# Patient Record
Sex: Male | Born: 1940 | Race: White | Hispanic: No | Marital: Married
Health system: Southern US, Community
[De-identification: ages and names within clinical notes are randomized; demographics above are authoritative.]

---

## 2004-03-14 ENCOUNTER — Ambulatory Visit (HOSPITAL_BASED_OUTPATIENT_CLINIC_OR_DEPARTMENT_OTHER): Admission: RE | Admit: 2004-03-14 | Discharge: 2004-03-14 | Payer: Self-pay | Admitting: Orthopedic Surgery

## 2004-08-01 ENCOUNTER — Ambulatory Visit (HOSPITAL_BASED_OUTPATIENT_CLINIC_OR_DEPARTMENT_OTHER): Admission: RE | Admit: 2004-08-01 | Discharge: 2004-08-01 | Payer: Self-pay | Admitting: Orthopedic Surgery

## 2004-11-22 ENCOUNTER — Ambulatory Visit: Payer: Self-pay | Admitting: Internal Medicine

## 2006-12-03 ENCOUNTER — Ambulatory Visit: Payer: Self-pay | Admitting: Unknown Physician Specialty

## 2007-10-31 ENCOUNTER — Ambulatory Visit: Payer: Self-pay | Admitting: Unknown Physician Specialty

## 2010-06-14 ENCOUNTER — Ambulatory Visit: Payer: Self-pay | Admitting: Ophthalmology

## 2012-07-04 ENCOUNTER — Ambulatory Visit: Payer: Self-pay | Admitting: Unknown Physician Specialty

## 2012-09-16 ENCOUNTER — Ambulatory Visit: Payer: Self-pay

## 2012-09-16 ENCOUNTER — Other Ambulatory Visit: Payer: Self-pay

## 2012-09-16 LAB — CK-MB: CK-MB: 1.5 ng/mL (ref 0.5–3.6)

## 2012-09-16 LAB — TROPONIN I: Troponin-I: <0.02 ng/mL

## 2012-09-19 ENCOUNTER — Ambulatory Visit: Payer: Self-pay | Admitting: Cardiothoracic Surgery

## 2012-09-19 LAB — CBC CANCER CENTER
Eosinophil #: 0.1 x10 3/mm (ref 0.0–0.7)
Eosinophil %: 1.9 %
HCT: 46.9 % (ref 40.0–52.0)
HGB: 16.3 g/dL (ref 13.0–18.0)
MCH: 30.6 pg (ref 26.0–34.0)
MCHC: 34.8 g/dL (ref 32.0–36.0)
MCV: 88 fL (ref 80–100)
Monocyte %: 7.2 %
Neutrophil #: 5.1 x10 3/mm (ref 1.4–6.5)
Neutrophil %: 66 %
Platelet: 115 x10 3/mm — ABNORMAL LOW (ref 150–440)
WBC: 7.7 x10 3/mm (ref 3.8–10.6)

## 2012-09-19 LAB — PROTIME-INR: INR: 1

## 2012-09-19 LAB — COMPREHENSIVE METABOLIC PANEL
Alkaline Phosphatase: 151 U/L — ABNORMAL HIGH (ref 50–136)
Anion Gap: 10 (ref 7–16)
Bilirubin,Total: 0.4 mg/dL (ref 0.2–1.0)
Chloride: 101 mmol/L (ref 98–107)
Co2: 28 mmol/L (ref 21–32)
EGFR (African American): >60
EGFR (Non-African Amer.): >60
Glucose: 184 mg/dL — ABNORMAL HIGH (ref 65–99)
Osmolality: 283 (ref 275–301)
Potassium: 3.7 mmol/L (ref 3.5–5.1)
SGOT(AST): 31 U/L (ref 15–37)

## 2012-09-19 LAB — APTT: Activated PTT: 28.6 secs (ref 23.6–35.9)

## 2012-09-24 ENCOUNTER — Ambulatory Visit: Payer: Self-pay | Admitting: Cardiothoracic Surgery

## 2012-09-27 ENCOUNTER — Ambulatory Visit: Payer: Self-pay | Admitting: Internal Medicine

## 2012-09-29 ENCOUNTER — Ambulatory Visit: Payer: Self-pay | Admitting: Cardiothoracic Surgery

## 2012-09-29 ENCOUNTER — Ambulatory Visit: Payer: Self-pay | Admitting: Oncology

## 2012-10-01 LAB — CBC CANCER CENTER
Basophil #: 0.1 x10 3/mm (ref 0.0–0.1)
Basophil %: 0.7 %
Eosinophil %: 0.5 %
HCT: 47.6 % (ref 40.0–52.0)
Lymphocyte #: 2.9 x10 3/mm (ref 1.0–3.6)
MCH: 30.5 pg (ref 26.0–34.0)
MCHC: 34.5 g/dL (ref 32.0–36.0)
MCV: 89 fL (ref 80–100)
Neutrophil #: 11.7 x10 3/mm — ABNORMAL HIGH (ref 1.4–6.5)
Platelet: 159 x10 3/mm (ref 150–440)
RBC: 5.38 10*6/uL (ref 4.40–5.90)
RDW: 14 % (ref 11.5–14.5)

## 2012-10-01 LAB — COMPREHENSIVE METABOLIC PANEL
Albumin: 3.8 g/dL (ref 3.4–5.0)
Alkaline Phosphatase: 151 U/L — ABNORMAL HIGH (ref 50–136)
Anion Gap: 10 (ref 7–16)
BUN: 20 mg/dL — ABNORMAL HIGH (ref 7–18)
Bilirubin,Total: 1.5 mg/dL — ABNORMAL HIGH (ref 0.2–1.0)
Co2: 26 mmol/L (ref 21–32)
EGFR (African American): >60
Osmolality: 279 (ref 275–301)

## 2012-10-08 LAB — URINALYSIS, COMPLETE
Bacteria: NONE SEEN
Bilirubin,UR: NEGATIVE
Glucose,UR: >=500 mg/dL (ref 0–75)
Ketone: NEGATIVE
Leukocyte Esterase: NEGATIVE
Nitrite: NEGATIVE
Ph: 5 (ref 4.5–8.0)
Protein: NEGATIVE
Specific Gravity: 1.022 (ref 1.003–1.030)
Squamous Epithelial: <1

## 2012-10-08 LAB — CBC CANCER CENTER
HCT: 42.3 % (ref 40.0–52.0)
Lymphocyte #: 2.2 x10 3/mm (ref 1.0–3.6)
MCHC: 34.4 g/dL (ref 32.0–36.0)
MCV: 88 fL (ref 80–100)
Monocyte #: 0.2 x10 3/mm (ref 0.2–1.0)
Monocyte %: 1.3 %
Neutrophil #: 12.4 x10 3/mm — ABNORMAL HIGH (ref 1.4–6.5)
Neutrophil %: 82.1 %
RBC: 4.8 10*6/uL (ref 4.40–5.90)
WBC: 15.1 x10 3/mm — ABNORMAL HIGH (ref 3.8–10.6)

## 2012-10-10 LAB — URINE CULTURE

## 2012-10-11 ENCOUNTER — Ambulatory Visit: Payer: Self-pay | Admitting: Vascular Surgery

## 2012-10-16 LAB — CBC CANCER CENTER
Basophil #: 0.1 x10 3/mm (ref 0.0–0.1)
Basophil %: 1.1 %
HGB: 14.1 g/dL (ref 13.0–18.0)
Lymphocyte #: 1.9 x10 3/mm (ref 1.0–3.6)
MCH: 30.4 pg (ref 26.0–34.0)
Monocyte #: 0.8 x10 3/mm (ref 0.2–1.0)
Neutrophil #: 6.3 x10 3/mm (ref 1.4–6.5)
RDW: 13.9 % (ref 11.5–14.5)

## 2012-10-23 ENCOUNTER — Emergency Department: Payer: Self-pay | Admitting: Emergency Medicine

## 2012-10-23 LAB — CBC CANCER CENTER
Basophil #: 0.1 x10 3/mm (ref 0.0–0.1)
Eosinophil #: 0 x10 3/mm (ref 0.0–0.7)
Eosinophil %: 0.5 %
HCT: 37.6 % — ABNORMAL LOW (ref 40.0–52.0)
HGB: 13.1 g/dL (ref 13.0–18.0)
Lymphocyte %: 19 %
MCHC: 34.9 g/dL (ref 32.0–36.0)
MCV: 88 fL (ref 80–100)
Monocyte #: 0.8 x10 3/mm (ref 0.2–1.0)
Monocyte %: 9.1 %
Neutrophil #: 5.9 x10 3/mm (ref 1.4–6.5)
Platelet: 129 x10 3/mm — ABNORMAL LOW (ref 150–440)
RBC: 4.28 10*6/uL — ABNORMAL LOW (ref 4.40–5.90)
RDW: 14.4 % (ref 11.5–14.5)

## 2012-10-23 LAB — BASIC METABOLIC PANEL
Anion Gap: 12 (ref 7–16)
BUN: 11 mg/dL (ref 7–18)
Chloride: 102 mmol/L (ref 98–107)
Co2: 21 mmol/L (ref 21–32)
Creatinine: 1.42 mg/dL — ABNORMAL HIGH (ref 0.60–1.30)
EGFR (African American): 57 — ABNORMAL LOW
EGFR (Non-African Amer.): 49 — ABNORMAL LOW
Glucose: 525 mg/dL (ref 65–99)
Osmolality: 293 (ref 275–301)
Potassium: 4.5 mmol/L (ref 3.5–5.1)

## 2012-10-23 LAB — URINALYSIS, COMPLETE
Glucose,UR: >=500 mg/dL (ref 0–75)
Nitrite: NEGATIVE
Ph: 5 (ref 4.5–8.0)
Protein: NEGATIVE
RBC,UR: 1 /HPF (ref 0–5)
Specific Gravity: 1.028 (ref 1.003–1.030)
Squamous Epithelial: NONE SEEN

## 2012-10-23 LAB — COMPREHENSIVE METABOLIC PANEL
Alkaline Phosphatase: 249 U/L — ABNORMAL HIGH (ref 50–136)
Anion Gap: 9 (ref 7–16)
BUN: 8 mg/dL (ref 7–18)
Bilirubin,Total: 0.5 mg/dL (ref 0.2–1.0)
Chloride: 100 mmol/L (ref 98–107)
Creatinine: 1.27 mg/dL (ref 0.60–1.30)
EGFR (Non-African Amer.): 56 — ABNORMAL LOW
Glucose: 332 mg/dL — ABNORMAL HIGH (ref 65–99)
Potassium: 4.1 mmol/L (ref 3.5–5.1)
SGPT (ALT): 40 U/L (ref 12–78)
Sodium: 136 mmol/L (ref 136–145)

## 2012-10-23 LAB — CBC
HGB: 12.6 g/dL — ABNORMAL LOW (ref 13.0–18.0)
MCHC: 34.2 g/dL (ref 32.0–36.0)
MCV: 89 fL (ref 80–100)
Platelet: 120 10*3/uL — ABNORMAL LOW (ref 150–440)
RDW: 14.3 % (ref 11.5–14.5)

## 2012-10-23 LAB — TROPONIN I: Troponin-I: <0.02 ng/mL

## 2012-10-24 LAB — BASIC METABOLIC PANEL
Anion Gap: 10 (ref 7–16)
BUN: 14 mg/dL (ref 7–18)
Calcium, Total: 8.4 mg/dL — ABNORMAL LOW (ref 8.5–10.1)
Co2: 23 mmol/L (ref 21–32)
EGFR (African American): >60
EGFR (Non-African Amer.): 59 — ABNORMAL LOW
Glucose: 263 mg/dL — ABNORMAL HIGH (ref 65–99)
Potassium: 4.6 mmol/L (ref 3.5–5.1)
Sodium: 135 mmol/L — ABNORMAL LOW (ref 136–145)

## 2012-10-29 ENCOUNTER — Ambulatory Visit: Payer: Self-pay | Admitting: Cardiothoracic Surgery

## 2012-10-29 ENCOUNTER — Ambulatory Visit: Payer: Self-pay | Admitting: Oncology

## 2012-10-30 LAB — URINALYSIS, COMPLETE
Bacteria: NONE SEEN
Bilirubin,UR: NEGATIVE
Glucose,UR: 50 mg/dL (ref 0–75)
Leukocyte Esterase: NEGATIVE
Nitrite: NEGATIVE
Ph: 5 (ref 4.5–8.0)
Protein: 30
RBC,UR: 22 /HPF (ref 0–5)
Specific Gravity: 1.023 (ref 1.003–1.030)
Squamous Epithelial: 1
WBC UR: 2 /HPF (ref 0–5)

## 2012-10-30 LAB — CBC CANCER CENTER
Basophil #: 0.2 x10 3/mm — ABNORMAL HIGH (ref 0.0–0.1)
Basophil %: 0.8 %
Eosinophil #: 0 x10 3/mm (ref 0.0–0.7)
Eosinophil %: 0.2 %
HCT: 41.1 % (ref 40.0–52.0)
HGB: 14.1 g/dL (ref 13.0–18.0)
Lymphocyte %: 11.2 %
Lymphs Abs: 2.2 x10 3/mm (ref 1.0–3.6)
MCH: 30.1 pg (ref 26.0–34.0)
MCHC: 34.2 g/dL (ref 32.0–36.0)
MCV: 88 fL (ref 80–100)
Monocyte #: 0.4 x10 3/mm (ref 0.2–1.0)
Monocyte %: 2.1 %
Neutrophil #: 17 x10 3/mm — ABNORMAL HIGH (ref 1.4–6.5)
Neutrophil %: 85.7 %
Platelet: 142 x10 3/mm — ABNORMAL LOW (ref 150–440)
RBC: 4.67 x10 6/mm (ref 4.40–5.90)
RDW: 14.1 % (ref 11.5–14.5)
WBC: 19.8 x10 3/mm — ABNORMAL HIGH (ref 3.8–10.6)

## 2012-10-31 LAB — URINE CULTURE

## 2012-11-06 LAB — CBC CANCER CENTER
Basophil #: 0.1 x10 3/mm (ref 0.0–0.1)
Eosinophil %: 1.5 %
MCH: 30.2 pg (ref 26.0–34.0)
Monocyte #: 0.7 x10 3/mm (ref 0.2–1.0)
Platelet: 57 x10 3/mm — ABNORMAL LOW (ref 150–440)
RBC: 4.45 10*6/uL (ref 4.40–5.90)
RDW: 15.1 % — ABNORMAL HIGH (ref 11.5–14.5)

## 2012-11-13 LAB — CBC CANCER CENTER
Basophil #: 0.1 x10 3/mm (ref 0.0–0.1)
Eosinophil #: 0.1 x10 3/mm (ref 0.0–0.7)
Eosinophil %: 0.9 %
HCT: 37 % — ABNORMAL LOW (ref 40.0–52.0)
Lymphocyte #: 1.7 x10 3/mm (ref 1.0–3.6)
Lymphocyte %: 17.3 %
MCH: 30.3 pg (ref 26.0–34.0)
MCHC: 35.1 g/dL (ref 32.0–36.0)
MCV: 86 fL (ref 80–100)
Monocyte #: 0.8 x10 3/mm (ref 0.2–1.0)
Monocyte %: 8.1 %
Neutrophil #: 7 x10 3/mm — ABNORMAL HIGH (ref 1.4–6.5)

## 2012-11-13 LAB — COMPREHENSIVE METABOLIC PANEL
Albumin: 3.4 g/dL (ref 3.4–5.0)
Alkaline Phosphatase: 157 U/L — ABNORMAL HIGH (ref 50–136)
BUN: 10 mg/dL (ref 7–18)
Bilirubin,Total: 0.7 mg/dL (ref 0.2–1.0)
Calcium, Total: 9 mg/dL (ref 8.5–10.1)
Chloride: 105 mmol/L (ref 98–107)
Creatinine: 1.13 mg/dL (ref 0.60–1.30)
Glucose: 220 mg/dL — ABNORMAL HIGH (ref 65–99)
Potassium: 3.9 mmol/L (ref 3.5–5.1)
Total Protein: 7.4 g/dL (ref 6.4–8.2)

## 2012-11-20 LAB — CBC CANCER CENTER
Basophil #: 0.1 x10 3/mm (ref 0.0–0.1)
Basophil %: 0.7 %
Eosinophil #: 0.1 x10 3/mm (ref 0.0–0.7)
Eosinophil %: 0.5 %
HCT: 37.9 % — ABNORMAL LOW (ref 40.0–52.0)
HGB: 13 g/dL (ref 13.0–18.0)
Lymphocyte #: 2.1 x10 3/mm (ref 1.0–3.6)
MCHC: 34.3 g/dL (ref 32.0–36.0)
MCV: 89 fL (ref 80–100)
Monocyte #: 0.3 x10 3/mm (ref 0.2–1.0)
Monocyte %: 1.5 %
Neutrophil #: 15 x10 3/mm — ABNORMAL HIGH (ref 1.4–6.5)
Neutrophil %: 85.1 %
RBC: 4.25 10*6/uL — ABNORMAL LOW (ref 4.40–5.90)
RDW: 15.3 % — ABNORMAL HIGH (ref 11.5–14.5)

## 2012-11-27 LAB — CBC CANCER CENTER
Eosinophil #: 0.1 x10 3/mm (ref 0.0–0.7)
Eosinophil %: 1.6 %
HCT: 37.1 % — ABNORMAL LOW (ref 40.0–52.0)
HGB: 12.9 g/dL — ABNORMAL LOW (ref 13.0–18.0)
Lymphocyte #: 1.7 x10 3/mm (ref 1.0–3.6)
MCH: 31.1 pg (ref 26.0–34.0)
MCV: 90 fL (ref 80–100)
Monocyte %: 9.1 %
Neutrophil #: 4.7 x10 3/mm (ref 1.4–6.5)
Neutrophil %: 64.6 %
Platelet: 60 x10 3/mm — ABNORMAL LOW (ref 150–440)
RDW: 16.3 % — ABNORMAL HIGH (ref 11.5–14.5)
WBC: 7.2 x10 3/mm (ref 3.8–10.6)

## 2012-11-29 ENCOUNTER — Ambulatory Visit: Payer: Self-pay | Admitting: Cardiothoracic Surgery

## 2012-11-29 ENCOUNTER — Ambulatory Visit: Payer: Self-pay | Admitting: Oncology

## 2012-12-04 LAB — COMPREHENSIVE METABOLIC PANEL
Albumin: 3.3 g/dL — ABNORMAL LOW (ref 3.4–5.0)
Alkaline Phosphatase: 143 U/L — ABNORMAL HIGH (ref 50–136)
Bilirubin,Total: 0.5 mg/dL (ref 0.2–1.0)
Calcium, Total: 8.8 mg/dL (ref 8.5–10.1)
Chloride: 106 mmol/L (ref 98–107)
EGFR (Non-African Amer.): >60
Glucose: 233 mg/dL — ABNORMAL HIGH (ref 65–99)
Osmolality: 287 (ref 275–301)
SGOT(AST): 21 U/L (ref 15–37)
SGPT (ALT): 30 U/L (ref 12–78)
Total Protein: 7 g/dL (ref 6.4–8.2)

## 2012-12-04 LAB — CBC CANCER CENTER
Basophil #: 0.1 x10 3/mm (ref 0.0–0.1)
Eosinophil #: 0.1 x10 3/mm (ref 0.0–0.7)
Eosinophil %: 1.5 %
HCT: 34.2 % — ABNORMAL LOW (ref 40.0–52.0)
HGB: 11.9 g/dL — ABNORMAL LOW (ref 13.0–18.0)
Lymphocyte #: 1.3 x10 3/mm (ref 1.0–3.6)
MCH: 31.4 pg (ref 26.0–34.0)
MCV: 90 fL (ref 80–100)
Monocyte %: 8.9 %
Neutrophil #: 4.3 x10 3/mm (ref 1.4–6.5)
RBC: 3.8 10*6/uL — ABNORMAL LOW (ref 4.40–5.90)
WBC: 6.3 x10 3/mm (ref 3.8–10.6)

## 2012-12-11 LAB — CBC CANCER CENTER
Basophil #: 0.1 x10 3/mm (ref 0.0–0.1)
Basophil %: 0.7 %
Eosinophil %: 0.3 %
HCT: 36.9 % — ABNORMAL LOW (ref 40.0–52.0)
HGB: 12.6 g/dL — ABNORMAL LOW (ref 13.0–18.0)
Lymphocyte #: 2.1 x10 3/mm (ref 1.0–3.6)
MCH: 31.1 pg (ref 26.0–34.0)
MCV: 91 fL (ref 80–100)
Monocyte #: 0.4 x10 3/mm (ref 0.2–1.0)
Monocyte %: 1.9 %
Neutrophil #: 16.1 x10 3/mm — ABNORMAL HIGH (ref 1.4–6.5)
Neutrophil %: 86.1 %
RBC: 4.06 10*6/uL — ABNORMAL LOW (ref 4.40–5.90)
RDW: 16.8 % — ABNORMAL HIGH (ref 11.5–14.5)

## 2012-12-18 LAB — CREATININE, SERUM
Creatinine: 1.02 mg/dL (ref 0.60–1.30)
EGFR (African American): >60
EGFR (Non-African Amer.): >60

## 2012-12-18 LAB — CBC CANCER CENTER
Comment - H1-Com4: NORMAL
Eosinophil: 2 %
HCT: 34.2 % — ABNORMAL LOW (ref 40.0–52.0)
HGB: 11.7 g/dL — ABNORMAL LOW (ref 13.0–18.0)
MCH: 31 pg (ref 26.0–34.0)
Metamyelocyte: 2 %
Platelet: 71 x10 3/mm — ABNORMAL LOW (ref 150–440)
RBC: 3.76 10*6/uL — ABNORMAL LOW (ref 4.40–5.90)
Variant Lymphocyte: 2 %
WBC: 5.3 x10 3/mm (ref 3.8–10.6)

## 2012-12-18 LAB — SGOT (AST)(ARMC): SGOT(AST): 23 U/L (ref 15–37)

## 2012-12-18 LAB — ALBUMIN: Albumin: 3.6 g/dL (ref 3.4–5.0)

## 2012-12-25 LAB — CBC CANCER CENTER
Basophil %: 1.2 %
Eosinophil #: 0.1 x10 3/mm (ref 0.0–0.7)
Eosinophil %: 0.9 %
HCT: 35.4 % — ABNORMAL LOW (ref 40.0–52.0)
HGB: 12.5 g/dL — ABNORMAL LOW (ref 13.0–18.0)
Lymphocyte #: 1.4 x10 3/mm (ref 1.0–3.6)
Lymphocyte %: 20.6 %
MCH: 32.1 pg (ref 26.0–34.0)
MCHC: 35.3 g/dL (ref 32.0–36.0)
Monocyte #: 0.6 x10 3/mm (ref 0.2–1.0)
Neutrophil #: 4.5 x10 3/mm (ref 1.4–6.5)
Neutrophil %: 68.3 %
RBC: 3.89 10*6/uL — ABNORMAL LOW (ref 4.40–5.90)
RDW: 16.4 % — ABNORMAL HIGH (ref 11.5–14.5)
WBC: 6.5 x10 3/mm (ref 3.8–10.6)

## 2012-12-25 LAB — COMPREHENSIVE METABOLIC PANEL
Albumin: 3.5 g/dL (ref 3.4–5.0)
Alkaline Phosphatase: 110 U/L (ref 50–136)
BUN: 9 mg/dL (ref 7–18)
Bilirubin,Total: 0.6 mg/dL (ref 0.2–1.0)
Calcium, Total: 8.8 mg/dL (ref 8.5–10.1)
Chloride: 102 mmol/L (ref 98–107)
Creatinine: 1.07 mg/dL (ref 0.60–1.30)
EGFR (African American): >60
Osmolality: 283 (ref 275–301)
Potassium: 4 mmol/L (ref 3.5–5.1)
SGPT (ALT): 26 U/L (ref 12–78)

## 2012-12-30 ENCOUNTER — Ambulatory Visit: Payer: Self-pay | Admitting: Cardiothoracic Surgery

## 2012-12-30 ENCOUNTER — Ambulatory Visit: Payer: Self-pay | Admitting: Oncology

## 2013-01-01 LAB — CBC CANCER CENTER
Eosinophil #: 0.1 x10 3/mm (ref 0.0–0.7)
Eosinophil %: 0.5 %
HGB: 12.1 g/dL — ABNORMAL LOW (ref 13.0–18.0)
Lymphocyte #: 1.8 x10 3/mm (ref 1.0–3.6)
MCH: 30.6 pg (ref 26.0–34.0)
MCHC: 33.8 g/dL (ref 32.0–36.0)
MCV: 91 fL (ref 80–100)
Monocyte #: 0.3 x10 3/mm (ref 0.2–1.0)
Monocyte %: 2.8 %
Neutrophil #: 9.9 x10 3/mm — ABNORMAL HIGH (ref 1.4–6.5)
RBC: 3.95 10*6/uL — ABNORMAL LOW (ref 4.40–5.90)
RDW: 15.6 % — ABNORMAL HIGH (ref 11.5–14.5)
WBC: 12.3 x10 3/mm — ABNORMAL HIGH (ref 3.8–10.6)

## 2013-01-08 LAB — CBC CANCER CENTER
Basophil #: 0.1 x10 3/mm (ref 0.0–0.1)
Basophil %: 2.3 %
Eosinophil %: 1.3 %
HGB: 12.1 g/dL — ABNORMAL LOW (ref 13.0–18.0)
Lymphocyte #: 1.6 x10 3/mm (ref 1.0–3.6)
Lymphocyte %: 26.8 %
MCHC: 32.9 g/dL (ref 32.0–36.0)
MCV: 92 fL (ref 80–100)
Monocyte #: 0.7 x10 3/mm (ref 0.2–1.0)
Monocyte %: 11.7 %
Neutrophil #: 3.4 x10 3/mm (ref 1.4–6.5)
RBC: 4.02 10*6/uL — ABNORMAL LOW (ref 4.40–5.90)
RDW: 16.3 % — ABNORMAL HIGH (ref 11.5–14.5)
WBC: 5.8 x10 3/mm (ref 3.8–10.6)

## 2013-01-15 LAB — CBC CANCER CENTER
Eosinophil #: 0.1 x10 3/mm (ref 0.0–0.7)
Eosinophil %: 1.3 %
HGB: 12.3 g/dL — ABNORMAL LOW (ref 13.0–18.0)
Lymphocyte %: 19.3 %
MCH: 31.1 pg (ref 26.0–34.0)
MCHC: 33.7 g/dL (ref 32.0–36.0)
MCV: 92 fL (ref 80–100)
Monocyte #: 0.7 x10 3/mm (ref 0.2–1.0)
Monocyte %: 10.2 %
Platelet: 82 x10 3/mm — ABNORMAL LOW (ref 150–440)
RDW: 16.4 % — ABNORMAL HIGH (ref 11.5–14.5)
WBC: 6.4 x10 3/mm (ref 3.8–10.6)

## 2013-01-15 LAB — COMPREHENSIVE METABOLIC PANEL
Albumin: 3.6 g/dL (ref 3.4–5.0)
Alkaline Phosphatase: 128 U/L (ref 50–136)
Bilirubin,Total: 0.5 mg/dL (ref 0.2–1.0)
Chloride: 102 mmol/L (ref 98–107)
EGFR (Non-African Amer.): >60
Glucose: 193 mg/dL — ABNORMAL HIGH (ref 65–99)
Potassium: 3.7 mmol/L (ref 3.5–5.1)
SGOT(AST): 28 U/L (ref 15–37)
SGPT (ALT): 24 U/L (ref 12–78)
Sodium: 140 mmol/L (ref 136–145)
Total Protein: 7.3 g/dL (ref 6.4–8.2)

## 2013-01-22 LAB — CBC CANCER CENTER
Basophil %: 0.6 %
Eosinophil #: 0.1 x10 3/mm (ref 0.0–0.7)
HCT: 40 % (ref 40.0–52.0)
HGB: 13 g/dL (ref 13.0–18.0)
Lymphocyte %: 9.9 %
MCH: 30.2 pg (ref 26.0–34.0)
MCHC: 32.6 g/dL (ref 32.0–36.0)
Monocyte #: 0.5 x10 3/mm (ref 0.2–1.0)
Monocyte %: 2.5 %
Neutrophil #: 18.6 x10 3/mm — ABNORMAL HIGH (ref 1.4–6.5)
Neutrophil %: 86.5 %
RBC: 4.32 10*6/uL — ABNORMAL LOW (ref 4.40–5.90)
RDW: 15.7 % — ABNORMAL HIGH (ref 11.5–14.5)
WBC: 21.5 x10 3/mm — ABNORMAL HIGH (ref 3.8–10.6)

## 2013-01-28 LAB — CBC CANCER CENTER
Basophil %: 2.6 %
Eosinophil %: 1.3 %
HCT: 35 % — ABNORMAL LOW (ref 40.0–52.0)
Lymphocyte #: 1.3 x10 3/mm (ref 1.0–3.6)
Lymphocyte %: 21.5 %
MCHC: 33.3 g/dL (ref 32.0–36.0)
MCV: 93 fL (ref 80–100)
Monocyte #: 0.5 x10 3/mm (ref 0.2–1.0)
Neutrophil #: 3.8 x10 3/mm (ref 1.4–6.5)
RBC: 3.78 10*6/uL — ABNORMAL LOW (ref 4.40–5.90)
RDW: 15.6 % — ABNORMAL HIGH (ref 11.5–14.5)
WBC: 5.8 x10 3/mm (ref 3.8–10.6)

## 2013-01-28 LAB — COMPREHENSIVE METABOLIC PANEL
Alkaline Phosphatase: 157 U/L — ABNORMAL HIGH (ref 50–136)
Anion Gap: 11 (ref 7–16)
Bilirubin,Total: 0.6 mg/dL (ref 0.2–1.0)
Creatinine: 1.12 mg/dL (ref 0.60–1.30)
EGFR (African American): >60
Osmolality: 285 (ref 275–301)
Potassium: 4.1 mmol/L (ref 3.5–5.1)
SGOT(AST): 27 U/L (ref 15–37)
SGPT (ALT): 31 U/L (ref 12–78)
Sodium: 142 mmol/L (ref 136–145)
Total Protein: 7.2 g/dL (ref 6.4–8.2)

## 2013-01-29 ENCOUNTER — Ambulatory Visit: Payer: Self-pay | Admitting: Oncology

## 2013-03-04 ENCOUNTER — Ambulatory Visit: Payer: Self-pay | Admitting: Oncology

## 2013-03-04 LAB — CBC CANCER CENTER
Basophil #: 0.1 x10 3/mm (ref 0.0–0.1)
Basophil %: 1.2 %
Eosinophil #: 0.2 x10 3/mm (ref 0.0–0.7)
HGB: 13 g/dL (ref 13.0–18.0)
Lymphocyte #: 0.9 x10 3/mm — ABNORMAL LOW (ref 1.0–3.6)
Lymphocyte %: 19.8 %
MCH: 29.9 pg (ref 26.0–34.0)
MCV: 91 fL (ref 80–100)
Monocyte %: 6 %
Platelet: 72 x10 3/mm — ABNORMAL LOW (ref 150–440)
RDW: 13.6 % (ref 11.5–14.5)

## 2013-03-04 LAB — COMPREHENSIVE METABOLIC PANEL
Albumin: 3.4 g/dL (ref 3.4–5.0)
Alkaline Phosphatase: 92 U/L (ref 50–136)
Anion Gap: 8 (ref 7–16)
Bilirubin,Total: 0.8 mg/dL (ref 0.2–1.0)
Chloride: 102 mmol/L (ref 98–107)
Co2: 27 mmol/L (ref 21–32)
Creatinine: 1 mg/dL (ref 0.60–1.30)
EGFR (Non-African Amer.): >60
Glucose: 134 mg/dL — ABNORMAL HIGH (ref 65–99)
Potassium: 4.7 mmol/L (ref 3.5–5.1)
Total Protein: 8 g/dL (ref 6.4–8.2)

## 2013-03-20 LAB — COMPREHENSIVE METABOLIC PANEL
Anion Gap: 8 (ref 7–16)
Bilirubin,Total: 0.7 mg/dL (ref 0.2–1.0)
Calcium, Total: 8.8 mg/dL (ref 8.5–10.1)
Creatinine: 1.09 mg/dL (ref 0.60–1.30)
Glucose: 155 mg/dL — ABNORMAL HIGH (ref 65–99)
Osmolality: 280 (ref 275–301)
Potassium: 4.3 mmol/L (ref 3.5–5.1)

## 2013-03-20 LAB — CBC CANCER CENTER
Basophil #: 0.1 x10 3/mm (ref 0.0–0.1)
Basophil %: 1.2 %
HCT: 39.7 % — ABNORMAL LOW (ref 40.0–52.0)
Lymphocyte #: 1 x10 3/mm (ref 1.0–3.6)
MCH: 29.3 pg (ref 26.0–34.0)
MCV: 88 fL (ref 80–100)
Monocyte #: 0.4 x10 3/mm (ref 0.2–1.0)
Monocyte %: 7.9 %
Neutrophil #: 3.7 x10 3/mm (ref 1.4–6.5)
Neutrophil %: 70 %
Platelet: 75 x10 3/mm — ABNORMAL LOW (ref 150–440)
RDW: 13.4 % (ref 11.5–14.5)

## 2013-03-31 ENCOUNTER — Ambulatory Visit: Payer: Self-pay | Admitting: Oncology

## 2013-04-01 LAB — COMPREHENSIVE METABOLIC PANEL
Albumin: 3.1 g/dL — ABNORMAL LOW (ref 3.4–5.0)
Alkaline Phosphatase: 90 U/L
Anion Gap: 10 (ref 7–16)
BUN: 14 mg/dL (ref 7–18)
Calcium, Total: 8.7 mg/dL (ref 8.5–10.1)
Chloride: 103 mmol/L (ref 98–107)
Creatinine: 1.12 mg/dL (ref 0.60–1.30)
EGFR (African American): >60
SGOT(AST): 35 U/L (ref 15–37)
Sodium: 138 mmol/L (ref 136–145)
Total Protein: 7.6 g/dL (ref 6.4–8.2)

## 2013-04-01 LAB — CBC CANCER CENTER
Basophil #: 0 x10 3/mm (ref 0.0–0.1)
Basophil %: 0.8 %
Eosinophil #: 0.1 x10 3/mm (ref 0.0–0.7)
Eosinophil %: 2.2 %
HGB: 13.1 g/dL (ref 13.0–18.0)
Lymphocyte #: 1 x10 3/mm (ref 1.0–3.6)
Lymphocyte %: 15.3 %
MCH: 29.2 pg (ref 26.0–34.0)
MCHC: 33.5 g/dL (ref 32.0–36.0)
MCV: 87 fL (ref 80–100)
Monocyte #: 0.5 x10 3/mm (ref 0.2–1.0)
Monocyte %: 7.7 %
Neutrophil #: 4.7 x10 3/mm (ref 1.4–6.5)
RBC: 4.47 10*6/uL (ref 4.40–5.90)
RDW: 13.5 % (ref 11.5–14.5)
WBC: 6.4 x10 3/mm (ref 3.8–10.6)

## 2013-04-03 LAB — CBC CANCER CENTER
Basophil #: 0 x10 3/mm (ref 0.0–0.1)
Basophil %: 0.7 %
Eosinophil #: 0.1 x10 3/mm (ref 0.0–0.7)
HCT: 38.2 % — ABNORMAL LOW (ref 40.0–52.0)
HGB: 12.6 g/dL — ABNORMAL LOW (ref 13.0–18.0)
MCH: 28.6 pg (ref 26.0–34.0)
Monocyte #: 0.3 x10 3/mm (ref 0.2–1.0)
Neutrophil #: 3.6 x10 3/mm (ref 1.4–6.5)
Neutrophil %: 73.2 %
RDW: 13.5 % (ref 11.5–14.5)
WBC: 4.9 x10 3/mm (ref 3.8–10.6)

## 2013-04-10 LAB — COMPREHENSIVE METABOLIC PANEL
Albumin: 3.1 g/dL — ABNORMAL LOW (ref 3.4–5.0)
Anion Gap: 9 (ref 7–16)
BUN: 19 mg/dL — ABNORMAL HIGH (ref 7–18)
Chloride: 102 mmol/L (ref 98–107)
Co2: 27 mmol/L (ref 21–32)
Creatinine: 1.19 mg/dL (ref 0.60–1.30)
EGFR (African American): >60
SGOT(AST): 32 U/L (ref 15–37)
SGPT (ALT): 20 U/L (ref 12–78)
Sodium: 138 mmol/L (ref 136–145)

## 2013-04-10 LAB — CBC CANCER CENTER
Basophil #: 0.1 x10 3/mm (ref 0.0–0.1)
Basophil %: 1.3 %
Eosinophil %: 1.7 %
HCT: 38 % — ABNORMAL LOW (ref 40.0–52.0)
HGB: 12.4 g/dL — ABNORMAL LOW (ref 13.0–18.0)
Lymphocyte %: 17.1 %
MCH: 28.1 pg (ref 26.0–34.0)
Monocyte #: 0.3 x10 3/mm (ref 0.2–1.0)
Neutrophil %: 73.3 %
Platelet: 72 x10 3/mm — ABNORMAL LOW (ref 150–440)
RBC: 4.43 10*6/uL (ref 4.40–5.90)
RDW: 13.5 % (ref 11.5–14.5)

## 2013-04-17 LAB — CBC CANCER CENTER
Basophil %: 0.8 %
Eosinophil #: 0.2 x10 3/mm (ref 0.0–0.7)
HCT: 40.6 % (ref 40.0–52.0)
HGB: 13.3 g/dL (ref 13.0–18.0)
Lymphocyte #: 1.2 x10 3/mm (ref 1.0–3.6)
MCV: 84 fL (ref 80–100)
Monocyte %: 3.7 %
Neutrophil #: 3.7 x10 3/mm (ref 1.4–6.5)
Neutrophil %: 69.4 %
Platelet: 43 x10 3/mm — ABNORMAL LOW (ref 150–440)
RDW: 13.3 % (ref 11.5–14.5)

## 2013-04-22 LAB — CBC CANCER CENTER
Basophil %: 0.7 %
Eosinophil #: 0.2 x10 3/mm (ref 0.0–0.7)
HCT: 33.4 % — ABNORMAL LOW (ref 40.0–52.0)
Lymphocyte #: 0.9 x10 3/mm — ABNORMAL LOW (ref 1.0–3.6)
Lymphocyte %: 26.9 %
Monocyte #: 0.1 x10 3/mm — ABNORMAL LOW (ref 0.2–1.0)
Monocyte %: 4 %
Neutrophil #: 2 x10 3/mm (ref 1.4–6.5)
Platelet: 30 x10 3/mm — CL (ref 150–440)
RBC: 4.07 10*6/uL — ABNORMAL LOW (ref 4.40–5.90)
RDW: 13.5 % (ref 11.5–14.5)

## 2013-05-01 ENCOUNTER — Ambulatory Visit: Payer: Self-pay | Admitting: Oncology

## 2013-05-08 LAB — COMPREHENSIVE METABOLIC PANEL
ALK PHOS: 120 U/L — AB
Albumin: 3.1 g/dL — ABNORMAL LOW (ref 3.4–5.0)
Anion Gap: 11 (ref 7–16)
BUN: 13 mg/dL (ref 7–18)
Bilirubin,Total: 0.5 mg/dL (ref 0.2–1.0)
Calcium, Total: 8.6 mg/dL (ref 8.5–10.1)
Chloride: 102 mmol/L (ref 98–107)
Co2: 25 mmol/L (ref 21–32)
Creatinine: 1.11 mg/dL (ref 0.60–1.30)
EGFR (African American): >60
Glucose: 184 mg/dL — ABNORMAL HIGH (ref 65–99)
Osmolality: 281 (ref 275–301)
Potassium: 3.6 mmol/L (ref 3.5–5.1)
SGOT(AST): 35 U/L (ref 15–37)
SGPT (ALT): 25 U/L (ref 12–78)
Sodium: 138 mmol/L (ref 136–145)
Total Protein: 7.2 g/dL (ref 6.4–8.2)

## 2013-05-08 LAB — CBC CANCER CENTER
BASOS PCT: 0.9 %
Basophil #: 0 x10 3/mm (ref 0.0–0.1)
Eosinophil #: 0 x10 3/mm (ref 0.0–0.7)
Eosinophil %: 0.3 %
HCT: 33 % — ABNORMAL LOW (ref 40.0–52.0)
HGB: 10.7 g/dL — ABNORMAL LOW (ref 13.0–18.0)
LYMPHS ABS: 0.7 x10 3/mm — AB (ref 1.0–3.6)
Lymphocyte %: 17.4 %
MCH: 27.6 pg (ref 26.0–34.0)
MCHC: 32.6 g/dL (ref 32.0–36.0)
MCV: 85 fL (ref 80–100)
Monocyte #: 0.4 x10 3/mm (ref 0.2–1.0)
Monocyte %: 10.8 %
NEUTROS ABS: 2.7 x10 3/mm (ref 1.4–6.5)
NEUTROS PCT: 70.6 %
PLATELETS: 80 x10 3/mm — AB (ref 150–440)
RBC: 3.89 10*6/uL — AB (ref 4.40–5.90)
RDW: 16 % — AB (ref 11.5–14.5)
WBC: 3.9 x10 3/mm (ref 3.8–10.6)

## 2013-05-15 LAB — CBC CANCER CENTER
BASOS ABS: 0 x10 3/mm (ref 0.0–0.1)
BASOS PCT: 0.8 %
Eosinophil #: 0 x10 3/mm (ref 0.0–0.7)
Eosinophil %: 1.4 %
HCT: 33 % — ABNORMAL LOW (ref 40.0–52.0)
HGB: 10.9 g/dL — ABNORMAL LOW (ref 13.0–18.0)
LYMPHS ABS: 0.7 x10 3/mm — AB (ref 1.0–3.6)
Lymphocyte %: 23.8 %
MCH: 27.7 pg (ref 26.0–34.0)
MCHC: 32.9 g/dL (ref 32.0–36.0)
MCV: 84 fL (ref 80–100)
Monocyte #: 0.2 x10 3/mm (ref 0.2–1.0)
Monocyte %: 6.9 %
NEUTROS ABS: 2.1 x10 3/mm (ref 1.4–6.5)
Neutrophil %: 67.1 %
Platelet: 60 x10 3/mm — ABNORMAL LOW (ref 150–440)
RBC: 3.91 10*6/uL — AB (ref 4.40–5.90)
RDW: 17.1 % — AB (ref 11.5–14.5)
WBC: 3.1 x10 3/mm — AB (ref 3.8–10.6)

## 2013-05-15 LAB — CREATININE, SERUM
Creatinine: 1.05 mg/dL (ref 0.60–1.30)
EGFR (African American): >60
EGFR (Non-African Amer.): >60

## 2013-05-22 LAB — CBC CANCER CENTER
BASOS ABS: 0 x10 3/mm (ref 0.0–0.1)
Basophil %: 0.8 %
Eosinophil #: 0.1 x10 3/mm (ref 0.0–0.7)
Eosinophil %: 3.6 %
HCT: 34 % — AB (ref 40.0–52.0)
HGB: 11.3 g/dL — AB (ref 13.0–18.0)
Lymphocyte #: 1.3 x10 3/mm (ref 1.0–3.6)
Lymphocyte %: 33.1 %
MCH: 27.8 pg (ref 26.0–34.0)
MCHC: 33.2 g/dL (ref 32.0–36.0)
MCV: 84 fL (ref 80–100)
MONO ABS: 0.1 x10 3/mm — AB (ref 0.2–1.0)
MONOS PCT: 3.4 %
Neutrophil #: 2.3 x10 3/mm (ref 1.4–6.5)
Neutrophil %: 59.1 %
PLATELETS: 57 x10 3/mm — AB (ref 150–440)
RBC: 4.07 10*6/uL — ABNORMAL LOW (ref 4.40–5.90)
RDW: 16.9 % — AB (ref 11.5–14.5)
WBC: 3.9 x10 3/mm (ref 3.8–10.6)

## 2013-05-29 LAB — CBC CANCER CENTER
Basophil #: 0 x10 3/mm (ref 0.0–0.1)
Basophil %: 0.7 %
EOS ABS: 0.2 x10 3/mm (ref 0.0–0.7)
EOS PCT: 11 %
HCT: 30.4 % — ABNORMAL LOW (ref 40.0–52.0)
HGB: 10.1 g/dL — AB (ref 13.0–18.0)
LYMPHS ABS: 0.9 x10 3/mm — AB (ref 1.0–3.6)
Lymphocyte %: 40.9 %
MCH: 28.4 pg (ref 26.0–34.0)
MCHC: 33.2 g/dL (ref 32.0–36.0)
MCV: 86 fL (ref 80–100)
MONOS PCT: 9.6 %
Monocyte #: 0.2 x10 3/mm (ref 0.2–1.0)
Neutrophil #: 0.8 x10 3/mm — ABNORMAL LOW (ref 1.4–6.5)
Neutrophil %: 37.8 %
Platelet: 47 x10 3/mm — ABNORMAL LOW (ref 150–440)
RBC: 3.55 10*6/uL — AB (ref 4.40–5.90)
RDW: 19.1 % — AB (ref 11.5–14.5)
WBC: 2.2 x10 3/mm — ABNORMAL LOW (ref 3.8–10.6)

## 2013-05-29 LAB — COMPREHENSIVE METABOLIC PANEL
ALK PHOS: 109 U/L
AST: 23 U/L (ref 15–37)
Albumin: 3.2 g/dL — ABNORMAL LOW (ref 3.4–5.0)
Anion Gap: 11 (ref 7–16)
BUN: 10 mg/dL (ref 7–18)
Bilirubin,Total: 0.6 mg/dL (ref 0.2–1.0)
CREATININE: 1.1 mg/dL (ref 0.60–1.30)
Calcium, Total: 7.3 mg/dL — ABNORMAL LOW (ref 8.5–10.1)
Chloride: 108 mmol/L — ABNORMAL HIGH (ref 98–107)
Co2: 23 mmol/L (ref 21–32)
EGFR (African American): >60
EGFR (Non-African Amer.): >60
Glucose: 176 mg/dL — ABNORMAL HIGH (ref 65–99)
Osmolality: 286 (ref 275–301)
POTASSIUM: 3.4 mmol/L — AB (ref 3.5–5.1)
SGPT (ALT): 25 U/L (ref 12–78)
Sodium: 142 mmol/L (ref 136–145)
TOTAL PROTEIN: 7 g/dL (ref 6.4–8.2)

## 2013-06-01 ENCOUNTER — Ambulatory Visit: Payer: Self-pay | Admitting: Oncology

## 2013-06-03 ENCOUNTER — Ambulatory Visit: Payer: Self-pay | Admitting: Oncology

## 2013-06-18 LAB — COMPREHENSIVE METABOLIC PANEL
ALK PHOS: 118 U/L — AB
ANION GAP: 9 (ref 7–16)
AST: 54 U/L — AB (ref 15–37)
Albumin: 3.3 g/dL — ABNORMAL LOW (ref 3.4–5.0)
BILIRUBIN TOTAL: 0.9 mg/dL (ref 0.2–1.0)
BUN: 13 mg/dL (ref 7–18)
CALCIUM: 8.2 mg/dL — AB (ref 8.5–10.1)
CO2: 27 mmol/L (ref 21–32)
Chloride: 102 mmol/L (ref 98–107)
Creatinine: 1.05 mg/dL (ref 0.60–1.30)
EGFR (African American): >60
Glucose: 186 mg/dL — ABNORMAL HIGH (ref 65–99)
OSMOLALITY: 281 (ref 275–301)
POTASSIUM: 4.1 mmol/L (ref 3.5–5.1)
SGPT (ALT): 32 U/L (ref 12–78)
SODIUM: 138 mmol/L (ref 136–145)
Total Protein: 7.1 g/dL (ref 6.4–8.2)

## 2013-06-18 LAB — CBC CANCER CENTER
Basophil #: 0.1 x10 3/mm (ref 0.0–0.1)
Basophil %: 1.3 %
Eosinophil #: 0.1 x10 3/mm (ref 0.0–0.7)
Eosinophil %: 1.3 %
HCT: 31.5 % — ABNORMAL LOW (ref 40.0–52.0)
HGB: 10.5 g/dL — AB (ref 13.0–18.0)
LYMPHS ABS: 0.5 x10 3/mm — AB (ref 1.0–3.6)
Lymphocyte %: 12.1 %
MCH: 31.1 pg (ref 26.0–34.0)
MCHC: 33.4 g/dL (ref 32.0–36.0)
MCV: 93 fL (ref 80–100)
Monocyte #: 0.5 x10 3/mm (ref 0.2–1.0)
Monocyte %: 11.2 %
Neutrophil #: 3.1 x10 3/mm (ref 1.4–6.5)
Neutrophil %: 74.1 %
PLATELETS: 52 x10 3/mm — AB (ref 150–440)
RBC: 3.38 10*6/uL — AB (ref 4.40–5.90)
RDW: 22.7 % — ABNORMAL HIGH (ref 11.5–14.5)
WBC: 4.1 x10 3/mm (ref 3.8–10.6)

## 2013-06-26 ENCOUNTER — Emergency Department: Payer: Self-pay | Admitting: Emergency Medicine

## 2013-06-29 ENCOUNTER — Ambulatory Visit: Payer: Self-pay | Admitting: Oncology

## 2013-06-30 LAB — CBC CANCER CENTER
BASOS PCT: 3.7 %
Basophil #: 0.2 x10 3/mm — ABNORMAL HIGH (ref 0.0–0.1)
EOS ABS: 0.2 x10 3/mm (ref 0.0–0.7)
Eosinophil %: 2.3 %
HCT: 31.9 % — AB (ref 40.0–52.0)
HGB: 10.3 g/dL — ABNORMAL LOW (ref 13.0–18.0)
LYMPHS ABS: 1.1 x10 3/mm (ref 1.0–3.6)
Lymphocyte %: 16.4 %
MCH: 30.2 pg (ref 26.0–34.0)
MCHC: 32.3 g/dL (ref 32.0–36.0)
MCV: 93 fL (ref 80–100)
MONO ABS: 0.4 x10 3/mm (ref 0.2–1.0)
MONOS PCT: 6.1 %
NEUTROS ABS: 4.8 x10 3/mm (ref 1.4–6.5)
Neutrophil %: 71.5 %
PLATELETS: 91 x10 3/mm — AB (ref 150–440)
RBC: 3.42 10*6/uL — AB (ref 4.40–5.90)
RDW: 19.2 % — AB (ref 11.5–14.5)
WBC: 6.7 x10 3/mm (ref 3.8–10.6)

## 2013-06-30 LAB — COMPREHENSIVE METABOLIC PANEL
ALK PHOS: 171 U/L — AB
ALT: 44 U/L (ref 12–78)
ANION GAP: 9 (ref 7–16)
AST: 57 U/L — AB (ref 15–37)
Albumin: 3.1 g/dL — ABNORMAL LOW (ref 3.4–5.0)
BILIRUBIN TOTAL: 0.7 mg/dL (ref 0.2–1.0)
BUN: 29 mg/dL — ABNORMAL HIGH (ref 7–18)
CO2: 28 mmol/L (ref 21–32)
CREATININE: 1.2 mg/dL (ref 0.60–1.30)
Calcium, Total: 8.4 mg/dL — ABNORMAL LOW (ref 8.5–10.1)
Chloride: 103 mmol/L (ref 98–107)
EGFR (African American): >60
Glucose: 135 mg/dL — ABNORMAL HIGH (ref 65–99)
OSMOLALITY: 287 (ref 275–301)
POTASSIUM: 4 mmol/L (ref 3.5–5.1)
Sodium: 140 mmol/L (ref 136–145)
TOTAL PROTEIN: 7.1 g/dL (ref 6.4–8.2)

## 2013-07-02 LAB — PSA: PSA: 0.1 ng/mL (ref 0.0–4.0)

## 2013-07-07 LAB — CBC CANCER CENTER
Basophil #: 0 x10 3/mm (ref 0.0–0.1)
Basophil %: 0.6 %
EOS PCT: 3 %
Eosinophil #: 0.2 x10 3/mm (ref 0.0–0.7)
HCT: 31.4 % — ABNORMAL LOW (ref 40.0–52.0)
HGB: 10.2 g/dL — ABNORMAL LOW (ref 13.0–18.0)
LYMPHS PCT: 15.2 %
Lymphocyte #: 1 x10 3/mm (ref 1.0–3.6)
MCH: 29.9 pg (ref 26.0–34.0)
MCHC: 32.3 g/dL (ref 32.0–36.0)
MCV: 93 fL (ref 80–100)
MONO ABS: 0.2 x10 3/mm (ref 0.2–1.0)
Monocyte %: 3.6 %
NEUTROS ABS: 5 x10 3/mm (ref 1.4–6.5)
Neutrophil %: 77.6 %
PLATELETS: 66 x10 3/mm — AB (ref 150–440)
RBC: 3.4 10*6/uL — ABNORMAL LOW (ref 4.40–5.90)
RDW: 17.3 % — ABNORMAL HIGH (ref 11.5–14.5)
WBC: 6.4 x10 3/mm (ref 3.8–10.6)

## 2013-07-14 LAB — CBC CANCER CENTER
BASOS PCT: 2.5 %
Basophil #: 0.1 x10 3/mm (ref 0.0–0.1)
Eosinophil #: 0.1 x10 3/mm (ref 0.0–0.7)
Eosinophil %: 2.3 %
HCT: 30.9 % — ABNORMAL LOW (ref 40.0–52.0)
HGB: 10 g/dL — ABNORMAL LOW (ref 13.0–18.0)
LYMPHS ABS: 0.6 x10 3/mm — AB (ref 1.0–3.6)
LYMPHS PCT: 26.6 %
MCH: 30 pg (ref 26.0–34.0)
MCHC: 32.2 g/dL (ref 32.0–36.0)
MCV: 93 fL (ref 80–100)
Monocyte #: 0.5 x10 3/mm (ref 0.2–1.0)
Monocyte %: 19.2 %
NEUTROS ABS: 1.2 x10 3/mm — AB (ref 1.4–6.5)
Neutrophil %: 49.4 %
PLATELETS: 74 x10 3/mm — AB (ref 150–440)
RBC: 3.31 10*6/uL — AB (ref 4.40–5.90)
RDW: 17.3 % — ABNORMAL HIGH (ref 11.5–14.5)
WBC: 2.3 x10 3/mm — ABNORMAL LOW (ref 3.8–10.6)

## 2013-07-21 LAB — URINALYSIS, COMPLETE
BILIRUBIN, UR: NEGATIVE
Bacteria: NONE SEEN
Glucose,UR: NEGATIVE mg/dL (ref 0–75)
Ketone: NEGATIVE
LEUKOCYTE ESTERASE: NEGATIVE
Nitrite: NEGATIVE
PH: 5 (ref 4.5–8.0)
PROTEIN: NEGATIVE
Specific Gravity: 1.024 (ref 1.003–1.030)
Squamous Epithelial: 5
Transitional Epi: 2

## 2013-07-21 LAB — COMPREHENSIVE METABOLIC PANEL
ALBUMIN: 2.8 g/dL — AB (ref 3.4–5.0)
Alkaline Phosphatase: 199 U/L — ABNORMAL HIGH
Anion Gap: 12 (ref 7–16)
BILIRUBIN TOTAL: 0.9 mg/dL (ref 0.2–1.0)
BUN: 18 mg/dL (ref 7–18)
CHLORIDE: 100 mmol/L (ref 98–107)
CO2: 25 mmol/L (ref 21–32)
Calcium, Total: 8.1 mg/dL — ABNORMAL LOW (ref 8.5–10.1)
Creatinine: 1.17 mg/dL (ref 0.60–1.30)
EGFR (African American): >60
EGFR (Non-African Amer.): >60
Glucose: 231 mg/dL — ABNORMAL HIGH (ref 65–99)
Osmolality: 283 (ref 275–301)
Potassium: 3.7 mmol/L (ref 3.5–5.1)
SGOT(AST): 82 U/L — ABNORMAL HIGH (ref 15–37)
SGPT (ALT): 27 U/L (ref 12–78)
Sodium: 137 mmol/L (ref 136–145)
Total Protein: 6.7 g/dL (ref 6.4–8.2)

## 2013-07-21 LAB — CBC CANCER CENTER
BASOS ABS: 0 x10 3/mm (ref 0.0–0.1)
BASOS PCT: 0.4 %
Eosinophil #: 0 x10 3/mm (ref 0.0–0.7)
Eosinophil %: 1.1 %
HCT: 30.6 % — ABNORMAL LOW (ref 40.0–52.0)
HGB: 9.9 g/dL — AB (ref 13.0–18.0)
LYMPHS ABS: 0.6 x10 3/mm — AB (ref 1.0–3.6)
LYMPHS PCT: 16.2 %
MCH: 29.8 pg (ref 26.0–34.0)
MCHC: 32.5 g/dL (ref 32.0–36.0)
MCV: 92 fL (ref 80–100)
Monocyte #: 0.6 x10 3/mm (ref 0.2–1.0)
Monocyte %: 17.6 %
NEUTROS ABS: 2.3 x10 3/mm (ref 1.4–6.5)
Neutrophil %: 64.7 %
PLATELETS: 76 x10 3/mm — AB (ref 150–440)
RBC: 3.33 10*6/uL — AB (ref 4.40–5.90)
RDW: 16.4 % — ABNORMAL HIGH (ref 11.5–14.5)
WBC: 3.6 x10 3/mm — ABNORMAL LOW (ref 3.8–10.6)

## 2013-07-23 LAB — URINE CULTURE

## 2013-07-28 LAB — CBC CANCER CENTER
BASOS PCT: 1.3 %
Basophil #: 0.1 x10 3/mm (ref 0.0–0.1)
EOS PCT: 1 %
Eosinophil #: 0.1 x10 3/mm (ref 0.0–0.7)
HCT: 30.7 % — ABNORMAL LOW (ref 40.0–52.0)
HGB: 10 g/dL — ABNORMAL LOW (ref 13.0–18.0)
LYMPHS ABS: 0.9 x10 3/mm — AB (ref 1.0–3.6)
Lymphocyte %: 17 %
MCH: 29.5 pg (ref 26.0–34.0)
MCHC: 32.6 g/dL (ref 32.0–36.0)
MCV: 91 fL (ref 80–100)
MONO ABS: 0.2 x10 3/mm (ref 0.2–1.0)
Monocyte %: 3.5 %
Neutrophil #: 3.9 x10 3/mm (ref 1.4–6.5)
Neutrophil %: 77.2 %
PLATELETS: 35 x10 3/mm — AB (ref 150–440)
RBC: 3.39 10*6/uL — ABNORMAL LOW (ref 4.40–5.90)
RDW: 16.5 % — ABNORMAL HIGH (ref 11.5–14.5)
WBC: 5.1 x10 3/mm (ref 3.8–10.6)

## 2013-07-30 ENCOUNTER — Ambulatory Visit: Payer: Self-pay | Admitting: Oncology

## 2013-08-04 LAB — COMPREHENSIVE METABOLIC PANEL
ALBUMIN: 2.5 g/dL — AB (ref 3.4–5.0)
ANION GAP: 14 (ref 7–16)
AST: 52 U/L — AB (ref 15–37)
Alkaline Phosphatase: 186 U/L — ABNORMAL HIGH
BUN: 76 mg/dL — AB (ref 7–18)
Bilirubin,Total: 0.8 mg/dL (ref 0.2–1.0)
CHLORIDE: 97 mmol/L — AB (ref 98–107)
CO2: 25 mmol/L (ref 21–32)
CREATININE: 2.48 mg/dL — AB (ref 0.60–1.30)
Calcium, Total: 8.9 mg/dL (ref 8.5–10.1)
EGFR (Non-African Amer.): 25 — ABNORMAL LOW
GFR CALC AF AMER: 29 — AB
GLUCOSE: 199 mg/dL — AB (ref 65–99)
Osmolality: 300 (ref 275–301)
Potassium: 6.2 mmol/L — ABNORMAL HIGH (ref 3.5–5.1)
SGPT (ALT): 30 U/L (ref 12–78)
Sodium: 136 mmol/L (ref 136–145)
Total Protein: 7.1 g/dL (ref 6.4–8.2)

## 2013-08-04 LAB — CBC CANCER CENTER
BASOS ABS: 0 x10 3/mm (ref 0.0–0.1)
Basophil %: 0.2 %
Eosinophil #: 0 x10 3/mm (ref 0.0–0.7)
Eosinophil %: 0.1 %
HCT: 30.3 % — ABNORMAL LOW (ref 40.0–52.0)
HGB: 9.7 g/dL — ABNORMAL LOW (ref 13.0–18.0)
LYMPHS PCT: 6.8 %
Lymphocyte #: 0.6 x10 3/mm — ABNORMAL LOW (ref 1.0–3.6)
MCH: 29 pg (ref 26.0–34.0)
MCHC: 32 g/dL (ref 32.0–36.0)
MCV: 91 fL (ref 80–100)
MONOS PCT: 10.3 %
Monocyte #: 1 x10 3/mm (ref 0.2–1.0)
NEUTROS PCT: 82.6 %
Neutrophil #: 7.8 x10 3/mm — ABNORMAL HIGH (ref 1.4–6.5)
Platelet: 123 x10 3/mm — ABNORMAL LOW (ref 150–440)
RBC: 3.34 10*6/uL — AB (ref 4.40–5.90)
RDW: 17.4 % — AB (ref 11.5–14.5)
WBC: 9.5 x10 3/mm (ref 3.8–10.6)

## 2013-08-07 ENCOUNTER — Ambulatory Visit: Payer: Self-pay | Admitting: Oncology

## 2013-08-07 LAB — BASIC METABOLIC PANEL
ANION GAP: 8 (ref 7–16)
BUN: 95 mg/dL — AB (ref 7–18)
CALCIUM: 8 mg/dL — AB (ref 8.5–10.1)
Chloride: 100 mmol/L (ref 98–107)
Co2: 25 mmol/L (ref 21–32)
Creatinine: 2.45 mg/dL — ABNORMAL HIGH (ref 0.60–1.30)
EGFR (Non-African Amer.): 25 — ABNORMAL LOW
GFR CALC AF AMER: 29 — AB
GLUCOSE: 199 mg/dL — AB (ref 65–99)
Osmolality: 301 (ref 275–301)
POTASSIUM: 5.8 mmol/L — AB (ref 3.5–5.1)
Sodium: 133 mmol/L — ABNORMAL LOW (ref 136–145)

## 2013-08-07 LAB — PROTIME-INR
INR: 1.3
PROTHROMBIN TIME: 16.3 s — AB (ref 11.5–14.7)

## 2013-08-29 ENCOUNTER — Ambulatory Visit: Payer: Self-pay | Admitting: Oncology

## 2013-08-29 DEATH — deceased

## 2014-08-21 NOTE — Consult Note (Signed)
Reason for Visit: This 74 year old Male patient presents to the clinic for initial evaluation of  lung cancer .   Referred by Dr. Doylene Canning.  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 Has been on chemotherapy since June of 2014 now with progressive disease and left hilum increasing shortness of breath and hemoptysis. For palliative radiation therapy to his chest.  Pathology Report pathology report reviewed   Imaging Report PET/CT and serial CT scans reviewed   Referral Report clinical notes reviewed   Planned Treatment Regimen palliative radiation therapy to chest   HPI   patient is a 74 year old male well known to our Department having been treated back in the 90s for prostate cancer. He presented in May of 2014 with increasing shortness of breath dyspnea on exertion and a productive cough. Chest x-ray at that time showed a left hilar mass bronchoscopy was performed and positive for small cell undifferentiated carcinoma. PET CT scan was performed showing widespread bony metastasis as well as hypermetabolic activity in the chest. He has been started in June of 2014 of carboplatin VP-16. He is done well with good resolution of bone pain although he continues to have significant lower back pain. Recently is having increasing shortness of breath dyspnea on exertion and hemoptysis. I been asked to evaluate the patient for consideration of palliative radiation therapy to his chest.  Past Hx:    bilateral lung cancer:    diabetes mellitis type II:    Rheumatoid Arthritis:    prostate cancer:    knee arthroscopy:    prostate surgery:    Rotator Cuff Surgery:    hernia repair:   Past, Family and Social History:  Past Medical History positive   Genitourinary prostate cancer   Endocrine diabetes mellitus   Past Surgical History herniorrhaphy repair, rotator cuff surgery, and knee  arthroscopic surgery   Past Medical History Comments arthritis   Family History noncontributory   Social History positive   Social History Comments has smoked 2 cigars a week for many years. No significant EtOH use history   Additional Past Medical and Surgical History accompanied by wife and son today   Allergies:   Demerol: Other  Home Meds:  Home Medications: Medication Instructions Status  meloxicam 15 mg oral tablet 1 tab(s) orally once a day Active  oxyCODONE 15 mg oral tablet 1 tab(s) orally every 6 hours Active  metFORMIN 500 mg oral tablet 1 tab(s) orally 2 times a day, may take up to 3 times a day as needed for elevated blood sugar Active  DuoNeb 0.5 mg-2.5 mg/3 mL inhalation solution 3 milliliter(s) inhaled 3 times a day Active  promethazine 25 mg oral tablet 1 tab(s) orally every 6 hours, As Needed - for Nausea, Vomiting Active  oxyCODONE 10 mg oral tablet 1 tab(s) orally every 4 to 6 hours Active  glucometer test strips 1   4 times a day, As Needed Active  Insulin syringes 1 dose(s)  3 times a day, to be used with sliding scale insulin Active  Regular Insulin sliding scale Check blood sugar 3 times a day before meals If blood sugar: 300-350, give 4 units SQ 351-400, give 6 units SQ 401-450, give 8 units SQ 451-500, give 10 units SQ >500, give 12 units SQ Active  folic acid 1 mg oral tablet 1 tab(s) orally once a day Active  Advair Diskus 250 mcg-50 mcg inhalation powder 1 puff(s)  inhaled 2 times a day Active  Proventil CFC free 90 mcg/inh inhalation aerosol 2-4 puffs  inhaled q4h prn Active  meloxicam 7.5 mg oral tablet 1 tab(s) orally once a day Active  hydroxychloroquine 200 mg oral tablet 1 tab(s) orally once a day Active   Review of Systems:  General negative   Performance Status (ECOG) 0   Skin negative   Breast negative   Ophthalmologic negative   ENMT negative   Respiratory and Thorax see HPI   Cardiovascular negative   Gastrointestinal  negative   Genitourinary see HPI   Musculoskeletal negative   Neurological negative   Psychiatric negative   Hematology/Lymphatics negative   Endocrine negative   Allergic/Immunologic negative   Review of Systems   review of systems obtained from nurse's notes  Nursing Notes:  Nursing Vital Signs and Chemo Nursing Nursing Notes: *CC Vital Signs Flowsheet:   20-Nov-14 10:00  Temp Temperature 98.3  Pulse Pulse 105  Respirations Respirations 18  SBP SBP 129  DBP DBP 80  Pain Scale (0-10)  0  Pulse Oxi  98  Current Weight (kg) (kg) 88.5  Height (cm) centimeters 182.9  BSA (m2) 2.1   Physical Exam:  General/Skin/HEENT:  General normal   Skin normal   Eyes normal   ENMT normal   Head and Neck normal   Additional PE well-developed male in NAD. Patient is somewhat short of breath. No cervical or supraclavicular adenopathy is identified. Lungs are clear to A&P cardiac examination shows regular rate and rhythm. Abdomen is benign with no organomegaly or masses noted.   Breasts/Resp/CV/GI/GU:  Respiratory and Thorax normal   Cardiovascular normal   Gastrointestinal normal   Genitourinary normal   MS/Neuro/Psych/Lymph:  Musculoskeletal normal   Neurological normal   Lymphatics normal   Other Results:  Radiology Results: LabUnknown:    27-May-14 15:23, PET/CT Scan Lung Cancer Diagnosis  PACS Image     13-Jun-14 07:52, MRI Brain  With/Without Contrast  PACS Image     19-Nov-14 08:44, CT Chest and Abd With Contrast  PACS Image   MRI:    13-Jun-14 07:52, MRI Brain  With/Without Contrast  MRI Brain  With/Without Contrast   REASON FOR EXAM:    Lung CA Small Cell  COMMENTS:       PROCEDURE: MR  - MR BRAIN WO/W CONTRAST  - Oct 11 2012  7:52AM     RESULT: History: Lung cancer.    Comparison Study: Head CT of 09/24/2012.    Findings: Standard MRI obtained with 90 cc of multi-Hance. A 1.5 cm   lesion is noted in the right parietal bone with enhancement.  This is   consistent with a metastatic lesion. No brain parenchymal abnormalities   identified. Posterior fossa including VIII nerve complexes are normal.   Ventricles arenondilated. Orbits are normal. Paranasal sinuses are   clear. Mild amount of fluid noted mastoids. Vascular flow voids are     normal.    IMPRESSION:  Parietal skull lesion consistent with a metastatic lesion.   No brain parenchyma/ intracranial l abnormality identified.        Verified By: Gwynn BurlyHOMAS E. REGISTER, M.D., MD  CT:    [phone]-Nov-14 08:44, CT Chest and Abd With Contrast  CT Chest and Abd With Contrast   REASON FOR EXAM:    lung CA restage           diabetic  COMMENTS:       PROCEDURE: KCT - KCT CHEST AND ABDOMEN W  CONTRAST  - Mar 19 2013  8:44AM     CLINICAL DATA:  Restaging lung cancer. No reported interval therapy.    EXAM:  CT CHEST, ABDOMEN, ANDPELVIS WITH CONTRAST    TECHNIQUE:  Multidetector CT imaging of the chest, abdomen and pelvis was  performed following the standard protocol during bolus  administration of intravenous contrast.  CONTRAST:  125 ml Isovue 370.    COMPARISON:  Chest and abdomen CT 01/27/2013.  PET-CT 09/24/2012.    FINDINGS:  CT CHEST FINDINGS    Repeat images through the chest were obtained due to motion on the  original series. There is an enlarging ill-defined mass within the  AP window extending into the left hilum. Within the AP window, this  measures 2.7 x 7.1 cm transverse on image 23. More inferolaterally  in the left hilum, there is a 3.9 x 3.6 cm component on image 26,  encasing and significantly narrowing the left lower lobe pulmonary  artery. There is also narrowing of the left lower lobe bronchus.  Apart from this dominant mass, there are no pathologically enlarged  mediastinal or right hilar lymph nodes.    Right IJ Port-A-Cath extends to the lower SVC. There is  atherosclerosis of the aorta, great vessels and coronary arteries.  No significant pleural or  pericardial effusion is present.    Scattered areas of subpleural reticulation and mild architectural  distortion are again demonstrated in both lungs. There is no  dominant mass or endobronchial lesion. A 4 mm subpleural nodule in  the lingula on image 27 is unchanged.    Old rib fractures are noted bilaterally. There is a superior  endplate compression deformity at T3 with associated superior  endplate Schmorl's node formation, mildosseous retropulsion and  osseous sclerosis. No associated epidural tumor is identified. This  appears grossly unchanged from the prior study, although comparison  is limited by the lack reformatted images on the previous study.    CT ABDOMEN AND PELVIS FINDINGS    There is diffuse contour irregularity of the liver with relative  enlargement of the caudate lobe consistent with cirrhosis. There is  a subtle low-density lesion inferiorly in the right hepatic lobe,  best seen on the delayed images, measuring 2.0 cm on image 16 of  series 4. No other focal liver lesions are identified.    There is stable splenomegaly without focal abnormality. The splenic  and portal veins are dilated but patent.    There is no adrenal mass. The gallbladder and pancreas appear  unremarkable. There is a 1.7 cm cyst in the interpolar region of the  left kidney. There is a small nonobstructing calculus in the lower  pole of the left kidney. The right kidney appears normal. There is  no hydronephrosis.    Again demonstrated is extensive aortic atherosclerosis with  irregular mural thrombus. There is a rind of soft tissue surrounding  the distal abdominal aorta, partially encasing the origin of the  inferior mesenteric artery and most consistent with retroperitoneal  fibrosis. This does not involve the ureters. No retroperitoneal  hematoma or adenopathy is identified.    The stomach and abdominal portions of the small bowel and colon  appear unremarkable.  Again  demonstrated is irregular sclerosis of the L3 vertebral body  without epidural tumor. There is a small associated superior  endplate Schmorl's node. This too appears grossly stable.     IMPRESSION:  1. Progressive mediastinal tumor within the AP window extending into  the left  hilum. There is mass effect on the left pulmonary artery  and left lower lobe bronchus.  2. No primary lung mass identified. Pulmonary fibrotic changes are  stable.  3. Hepatic cirrhosis with evidence of portal hypertension. There is  a subtle lesion inferiorly in theright hepatic lobe which is not  clearly seen on the prior studies, suspicious for metastatic  disease. In the setting of cirrhosis, hepatocellular carcinoma would  be an additional consideration. This may be more definitively  characterized with MRIif clinically warranted.  4. Stable sclerotic lesions and Schmorl's nodes at T3 and L3,  suspicious for osseous metastases.  5. Stable aortic ectasia and adjacent retroperitoneal fibrosis.      Electronically Signed    By: Roxy Horseman M.D.    On: 03/19/2013 09:32         Verified By: Gerrianne Scale, M.D.,  Nuclear Med:    27-May-14 15:23, PET/CT Scan Lung Cancer Diagnosis  PET/CT Scan Lung Cancer Diagnosis   REASON FOR EXAM:    Back pain Hip pain Hx of prostate CA  COMMENTS:       PROCEDURE: PET - PET/CT DX LUNG CA  - Sep 24 2012  3:23PM     RESULT: The patient has a fasting blood glucose level measuring 113   mg/dL. The patient received an injection of 12.45 mCi of fluorine 18   labeled fluorodeoxyglucose in the left antecubital region at 1345 hours   with imaging obtained between the hours of 1449 at 1516 hours from the   base of the brain into the thighs. Noncontrast low-dose CT is performed   over the same regions for the purposes of attenuation correction and   fusion. The noncontrast CT, attenuation corrected at images and fused   PET/CT data are reconstructed in the axial, coronal  and sagittal planes   by the [employer] software. There is no previous exam for comparison.  There is a large area of abnormal accumulation of activity in the AP   window region and left hilar region showing a maximum uptake of as much   as 14.5 SUV with a mean of 9.1. There multiple areas of abnormal   accumulation within the pelvis, ribs and spine including the sacrum, left   and right iliac bones and in the posterior right hemithorax in the right   lower lobe as well as in the sternum. Cervicothoracic increased   localization is seen within the spine. There does not appear to be   abnormal localization in the liver or spleen. Nonobstructing left renal   calculi are seen. Atherosclerotic calcification is present within the   aorta. Are the prostate is not enlarged. Fat filled left inguinal hernia   is present. There is some increased localization within the proximal left   femoral shaft subtrochanteric region. There is significant involvement   with bony erosion and adjacent soft tissue prominence in the right   lateral aspect of the fourth rib. Pathologic fracture appears present. No   adrenal mass is evident. No pleural or pericardial effusion is seen. The     thyroid appears unremarkable. Incompletely included on the study is a   focal area of increased localization in the right basal ganglia. MRI   followup may be beneficial as this is incompletely included. This appears   to be tiny and punctate.    IMPRESSION:  Findings concerning for underlying pulmonary malignancy with   widespread bony metastatic disease which could be from that process or  from underlying history prostate cancer including a pathologic fracture   laterally in the right fourth rib. There is involvement in the   cervicothoracic region, lumbar region, multiple ribs, pelvis and sacrum   as well as the sternum. Bronchoscopic correlation with biopsy is   recommended. Atherosclerotic calcification is present.  Nephrolithiasis is   present on the left.    Dictation Site: 1    Verified By: Elveria Royals, M.D., MD   Relevent Results:   Relevant Scans and Labs brain MRI, PET/CT scan and serial CT scans were reviewed   Assessment and Plan: Impression:   progressive small cell undifferentiated carcinoma in the chest on patient with extensive stage small cell lung cancer now with increasing shortness of breath dyspnea on exertion and hemoptysis. Plan:   at this time based on the new data presented at this year his [employer] meeting would like to offer palliative radiation therapy to his chest to prevent further shortness of breath dyspnea on exertion and hemoptysis. Would plan on delivering 3000 cGy in 10 fractions. Based on the urgent nature of his hemoptysis and shortness of breath have set him up for CT simulation tomorrow. Would like to get to treatments and prior to the Thanksgiving weekend. Risks and benefits of treatment including possible dysphagia secondary to radiation esophagitis, fatigue, destruction of some normal lung volume, skin reaction, and possible alteration of blood counts were all explained in detail to the patient. He seems to comprehend my treatment plan well. I have set him up for an urgent basis for CT simulation tomorrow. Case was personally discussed with Dr. Doylene Canning.  I would like to take this opportunity to thank you for allowing me to continue to participate in this patient's care.  CC Referral:  cc: dr. Sampson Goon   Electronic Signatures: Rushie Chestnut, Gordy Councilman (MD)  (Signed [phone] 10:58)  Authored: HPI, Diagnosis, Past Hx, PFSH, Allergies, Home Meds, ROS, Nursing Notes, Physical Exam, Other Results, Relevent Results, Encounter Assessment and Plan, CC Referring Physician   Last Updated: 20-Nov-14 10:58 by Rebeca Alert (MD)

## 2014-08-21 NOTE — Op Note (Signed)
PATIENT NAME:  Maryan RuedELSON, Cortney D MR#:  [id number] DATE OF BIRTH:  31-Jul-1940  DATE OF PROCEDURE:  10/11/2012  PREOPERATIVE DIAGNOSIS: Lung cancer.    POSTOPERATIVE DIAGNOSIS: Lung cancer.   PROCEDURE PERFORMED: Insertion of right internal jugular Port-A-Cath with ultrasound and fluoroscopic guidance.   SURGEON: Renford DillsGregory G. Zaidy Absher, MD  ASSISTANT: Hoyle Sauerhelsea N. Hanne, PA-C  ANESTHESIA: Versed plus fentanyl. Continuous ECG, pulse oximetry and cardiopulmonary monitoring was performed throughout the entire procedure by the interventional radiology nurse. Total sedation time was 45 minutes.   ACCESS: Right IJ.   CONTRAST USED: None.   FLUOROSCOPY TIME: Approximately 1 minute.   INDICATIONS: Mr. Delton Seeelson is a 74 year old gentleman who is undergoing chemotherapy for treatment of lung carcinoma and therefore requires adequate IV access. Risks and benefits and port placement were reviewed. All questions answered. The patient agrees to proceed.   DESCRIPTION OF PROCEDURE: The patient is taken to special procedures and placed in the supine position. After adequate sedation is achieved, his right neck and chest wall are prepped and draped in sterile fashion. Ultrasound is placed in a sterile sleeve. Jugular vein is identified. It is echolucent and compressible, indicating patency. Image is recorded for the permanent record. Under real-time visualization, after 1% lidocaine has been infiltrated into the base of the neck soft tissues as well as the soft tissues of the chest wall, access to the jugular vein is obtained with ultrasound visualization and a Seldinger needle. A J-wire is advanced under fluoroscopic guidance. A counterincision is created at the wire insertion site. A linear incision is created 2 fingerbreadths below the clavicle and a small pocket fashioned with both blunt and sharp dissection. The catheter is then pulled subcutaneously from the pocket to the neck counterincision. Dilator and peel-away  sheath are inserted over the wire. The dilator and wire are removed, and the catheter is inserted into the venous system. Under fluoroscopic guidance, the catheter is positioned with its tip at the atriocaval junction. It is then transected, the hub is connected and slipped into the subcutaneous pocket. The hub is then accessed percutaneously. It aspirates easily and flushes well. The subcutaneous tissues of the pocket incision are then reapproximated using interrupted 3-0 Vicryl. Skin incisions are closed with 4-0 Monocryl subcuticular and then Dermabond. The patient tolerated the procedure well, and there were no immediate complications. Sponge and needle counts were correct, and he was taken to the recovery area in excellent condition.  ____________________________ Renford DillsGregory G. Christinia Lambeth, MD ggs:OSi D: 10/11/2012 10:42:29 ET T: 10/11/2012 11:21:06 ET JOB#: [id number]  cc: Renford DillsGregory G. Denean Pavon, MD, <Dictator> Stann Mainlandavid P. Sampson GoonFitzgerald, MD Gerome SamJanak K. Doylene Canninghoksi, MD Renford DillsGREGORY G Hisao Doo MD ELECTRONICALLY SIGNED 10/16/2012 16:08

## 2016-02-09 IMAGING — CT CT ABD-PELV W/ CM
2 of 5 series · 15 of 46 positions shown, 17 images · IV contrast (isovue)
Comparison: 03/19/2013

CLINICAL DATA: Right groin pain for 2 weeks. Right low back pain.
History of lung cancer status post chemo radiation therapy. History
of prostate cancer status post prostatectomy. History of left
inguinal hernia repair.

EXAM:
CT ABDOMEN AND PELVIS WITH CONTRAST
TECHNIQUE: Multidetector CT imaging of the abdomen and pelvis was performed
using the standard protocol following bolus administration of
intravenous contrast.
CONTRAST:  100 mL Isovue 370

[Series 2: routine abd pel with · axial · 0.83mm/px · z∈[-940,-500]mm · 12 of 100 slices shown, 14 images]
[im 6/100  soft-tissue]
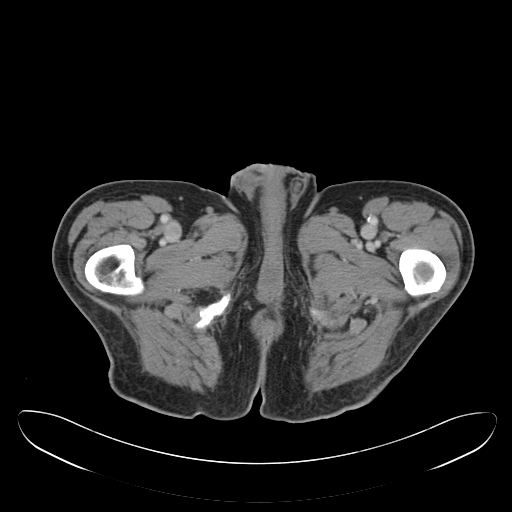
[im 6/100  bone]
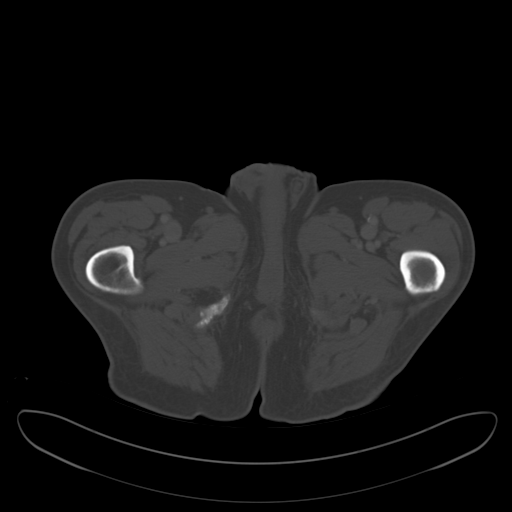
[im 17/100  soft-tissue]
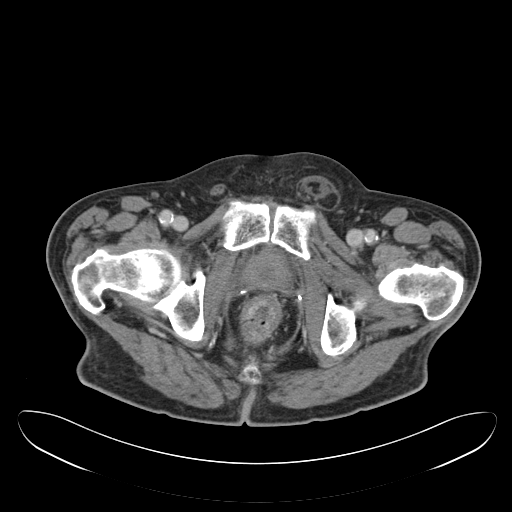
[im 23/100  soft-tissue]
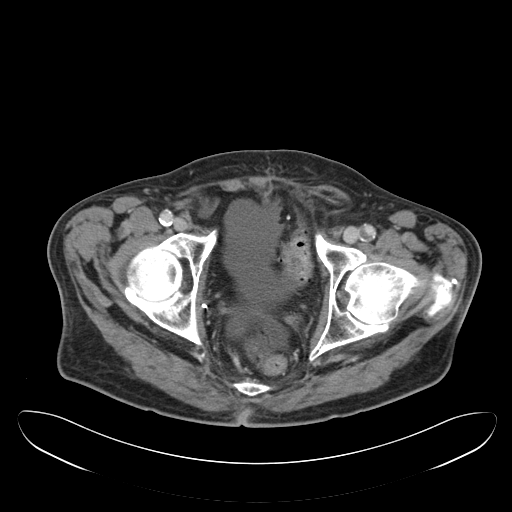
[im 28/100  soft-tissue]
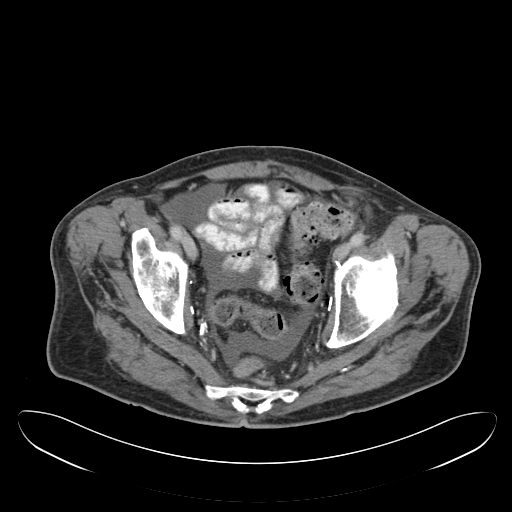
[im 39/100  soft-tissue]
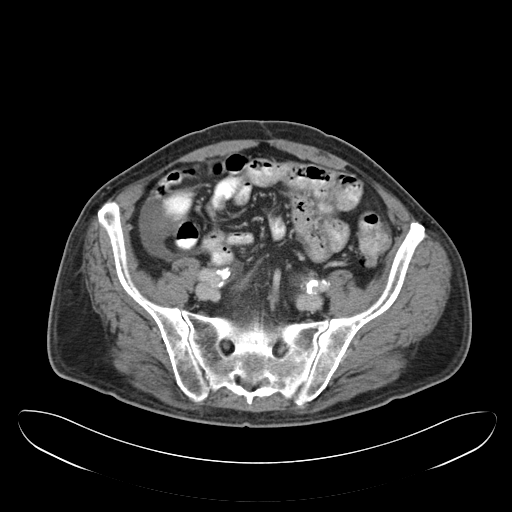
[im 45/100  soft-tissue]
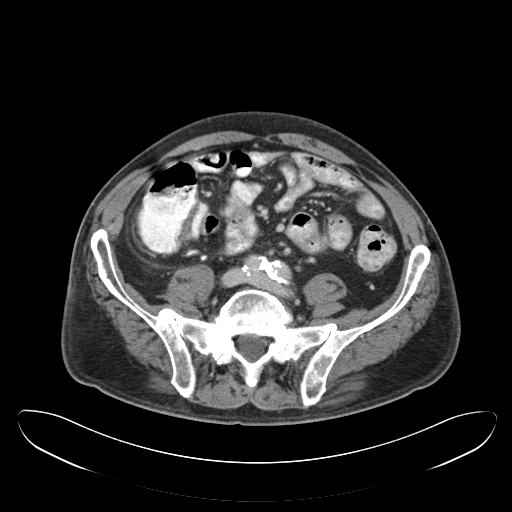
[im 56/100  soft-tissue]
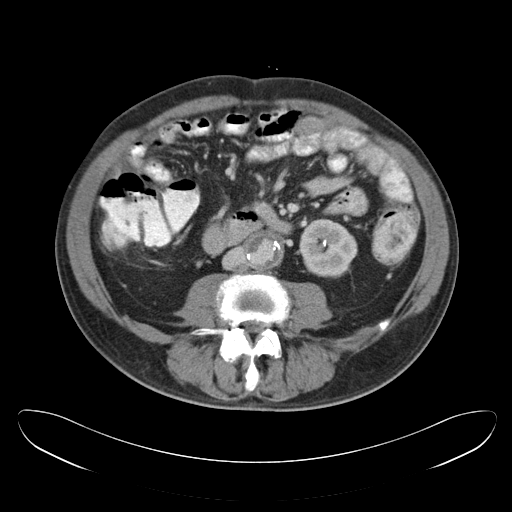
[im 61/100  soft-tissue]
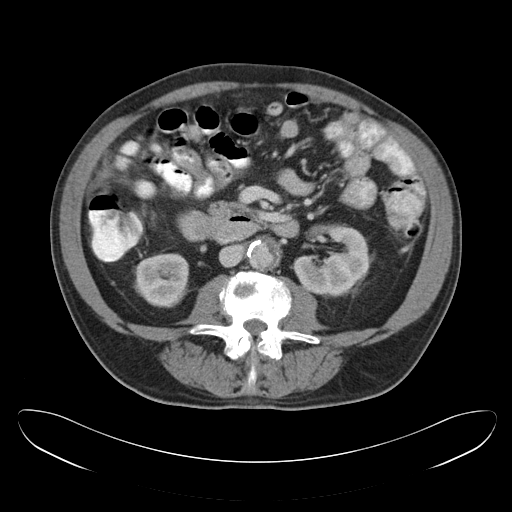
[im 72/100  soft-tissue]
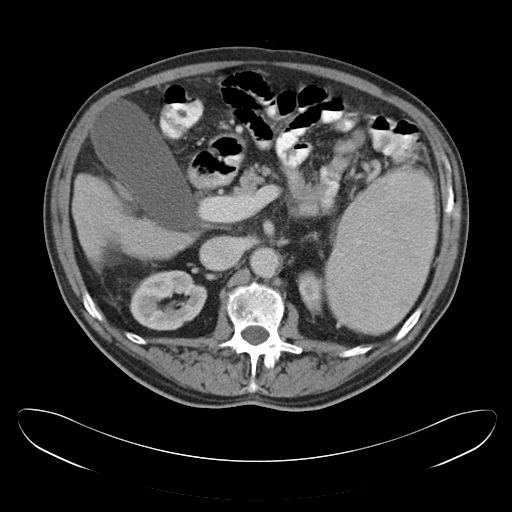
[im 72/100  bone]
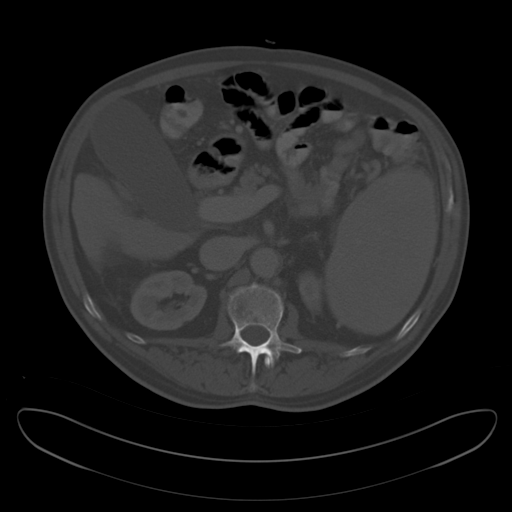
[im 78/100  soft-tissue]
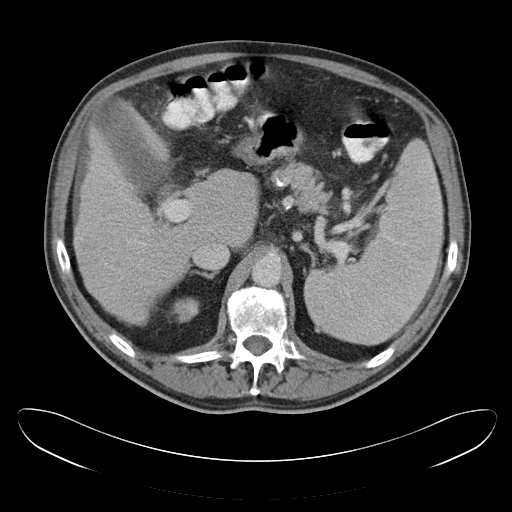
[im 83/100  soft-tissue]
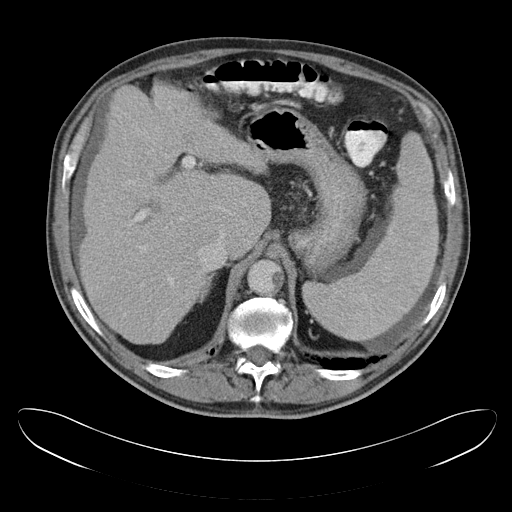
[im 94/100  soft-tissue]
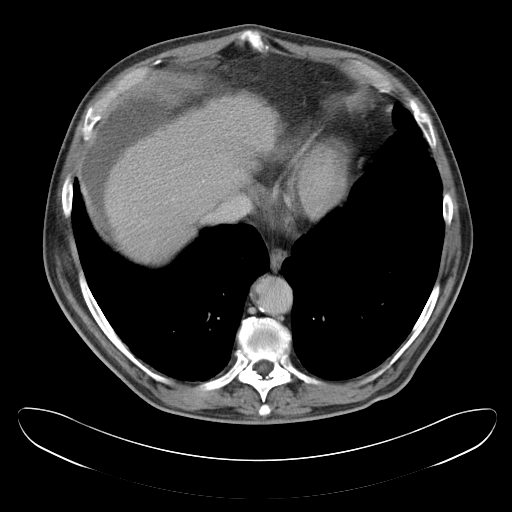

[Series 6: cor routine abd pel with · coronal · 0.76mm/px · 3 of 175 slices shown]
[im 59/175  soft-tissue]
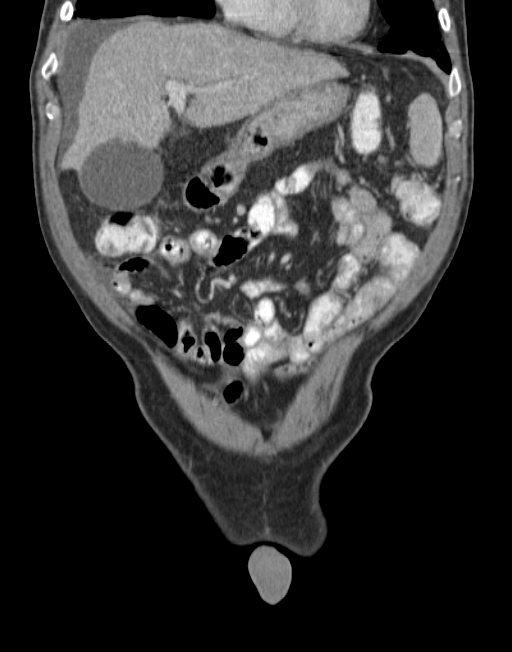
[im 78/175  soft-tissue]
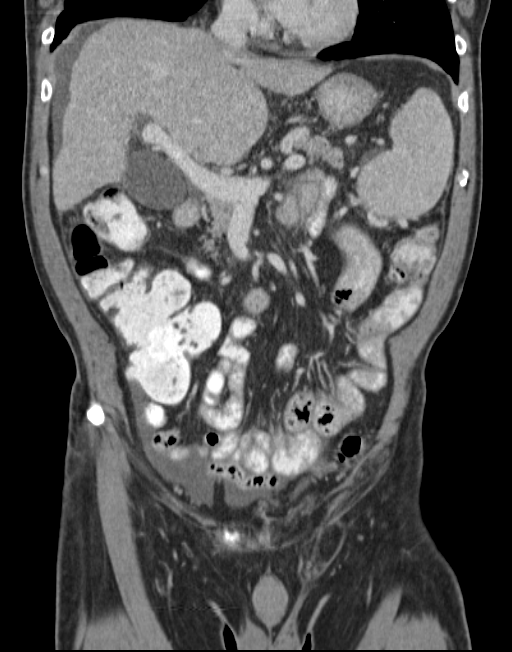
[im 97/175  soft-tissue]
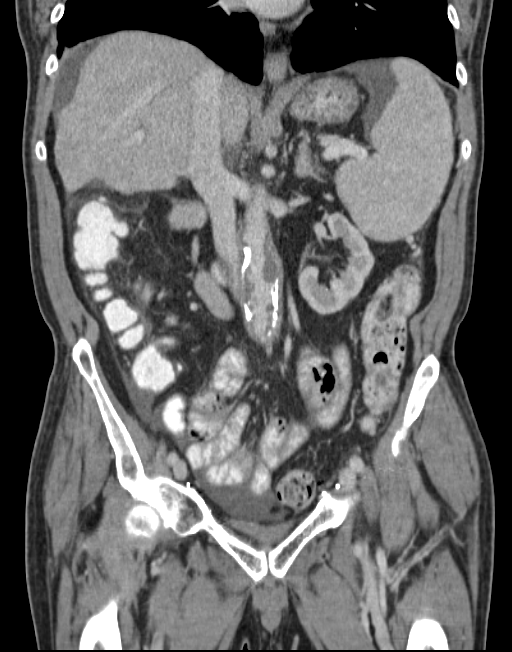

[15 of 46 positions shown; findings below may reference images not displayed]

FINDINGS: Fibrotic changes in the lung bases are unchanged. There is no
pleural effusion.

The liver again demonstrates a nodular contour compatible with
cirrhosis. Subtle hypodense lesion inferiorly in the right hepatic
lobe measuring approximately 2 cm is similar to the prior study.
Multiple additional, subtle hypoattenuating the lesions are seen
within both lobes of the liver, not evident on the prior study, for
example a 1.5 cm lesion in segment Maira (series 2, image 10), 1.3 cm
lesion in segment VII (series 2, image 12), and 1.7 cm lesion in
segment III (series 2, image 19) in addition to other, subcentimeter
lesions.

The gallbladder is hydropic. A punctate calcification is noted in
the spleen. Splenomegaly is stable to minimally decreased from the
prior study. The adrenal glands, right kidney, and pancreas have an
unremarkable enhanced appearance. 1.6 cm cyst in the upper pole of
the left kidney is unchanged. 4 mm nonobstructing calculus in the
lower pole of the left kidney is unchanged.

A duodenum diverticulum is again seen. There is left-sided colonic
diverticulosis without evidence of diverticulitis. The appendix is
identified in the right lower quadrant and is unremarkable. Contrast
is seen throughout the small and large bowel without evidence of
obstruction. Asymmetric fat and soft tissue stranding in the left
inguinal canal may reflect history of left inguinal hernia repair.
There is no evidence of right-sided inguinal hernia. No mass is
identified in the right groin in the area of clinical concern marked
by a BB.

Sequelae of prostatectomy are identified. The bladder is
decompressed. There is moderate volume ascites, new from prior. No
enlarged lymph nodes are identified. Extensive noncalcified and
calcified atheromatous plaque is seen involving the abdominal aorta
and iliac arteries. Irregular abdominal aortic mural thrombus is
similar to the prior study with unchanged soft tissue encasing the
distal abdominal aorta and IMA origin measuring 6 mm in thickness
and suggestive of retroperitoneal fibrosis. Extensive plaque also
extends into the femoral arteries. Irregular sclerosis and
depression of the superior endplate at L3 is unchanged.
IMPRESSION: 1. No etiology of right groin pain identified.
2. Numerous new, subtle liver lesions. This is concerning for
metastatic disease, and abdominal MRI could be performed for further
evaluation.
3. Cirrhosis, splenomegaly, and new, moderate volume ascites.
